# Patient Record
Sex: Male | Born: 1980 | Race: White | Hispanic: No | Marital: Single | Smoking: Current every day smoker
Health system: Southern US, Community
[De-identification: ages and names within clinical notes are randomized; demographics above are authoritative.]

## PROBLEM LIST (undated history)

## (undated) HISTORY — PX: INNER EAR SURGERY: SHX679

## (undated) HISTORY — PX: TONSILLECTOMY: SUR1361

## (undated) HISTORY — PX: FOOT SURGERY: SHX648

## (undated) HISTORY — PX: OTHER SURGICAL HISTORY: SHX169

---

## 2016-06-28 ENCOUNTER — Emergency Department (HOSPITAL_COMMUNITY): Payer: Self-pay

## 2016-06-28 ENCOUNTER — Emergency Department (HOSPITAL_COMMUNITY)
Admission: EM | Admit: 2016-06-28 | Discharge: 2016-06-28 | Disposition: A | Discharge status: Home / Self Care | Payer: Self-pay | Attending: Emergency Medicine | Admitting: Emergency Medicine

## 2016-06-28 ENCOUNTER — Encounter (HOSPITAL_COMMUNITY): Payer: Self-pay

## 2016-06-28 DIAGNOSIS — L03114 Cellulitis of left upper limb: Secondary | ICD-10-CM | POA: Insufficient documentation

## 2016-06-28 DIAGNOSIS — L0291 Cutaneous abscess, unspecified: Secondary | ICD-10-CM

## 2016-06-28 DIAGNOSIS — F17200 Nicotine dependence, unspecified, uncomplicated: Secondary | ICD-10-CM | POA: Insufficient documentation

## 2016-06-28 LAB — CBC WITH DIFFERENTIAL/PLATELET
BASOS PCT: 0 %
Basophils Absolute: 0 10*3/uL (ref 0.0–0.1)
EOS ABS: 0.2 10*3/uL (ref 0.0–0.7)
Eosinophils Relative: 1 %
HEMATOCRIT: 45.5 % (ref 39.0–52.0)
Hemoglobin: 15.5 g/dL (ref 13.0–17.0)
Lymphocytes Relative: 17 %
Lymphs Abs: 2.5 10*3/uL (ref 0.7–4.0)
MCH: 31.9 pg (ref 26.0–34.0)
MCHC: 34.1 g/dL (ref 30.0–36.0)
MCV: 93.6 fL (ref 78.0–100.0)
MONO ABS: 1.1 10*3/uL — AB (ref 0.1–1.0)
MONOS PCT: 8 %
NEUTROS ABS: 10.5 10*3/uL — AB (ref 1.7–7.7)
Neutrophils Relative %: 74 %
Platelets: 252 10*3/uL (ref 150–400)
RBC: 4.86 MIL/uL (ref 4.22–5.81)
RDW: 13.2 % (ref 11.5–15.5)
WBC: 14.2 10*3/uL — ABNORMAL HIGH (ref 4.0–10.5)

## 2016-06-28 LAB — BASIC METABOLIC PANEL
Anion gap: 7 (ref 5–15)
BUN: 9 mg/dL (ref 6–20)
CALCIUM: 9.1 mg/dL (ref 8.9–10.3)
CO2: 25 mmol/L (ref 22–32)
CREATININE: 0.77 mg/dL (ref 0.61–1.24)
Chloride: 105 mmol/L (ref 101–111)
GFR calc non Af Amer: >60 mL/min (ref >60)
Glucose, Bld: 92 mg/dL (ref 65–99)
Potassium: 3.7 mmol/L (ref 3.5–5.1)
Sodium: 137 mmol/L (ref 135–145)

## 2016-06-28 MED ORDER — MORPHINE SULFATE (PF) 4 MG/ML IV SOLN
4.0000 mg | Freq: Once | INTRAVENOUS | Status: AC
  Administered 2016-06-28: 4 mg via INTRAVENOUS
  Filled 2016-06-28: qty 1

## 2016-06-28 MED ORDER — SULFAMETHOXAZOLE-TRIMETHOPRIM 800-160 MG PO TABS: 1.0000 | ORAL_TABLET | Freq: Two times a day (BID) | ORAL | 0 refills | Status: AC

## 2016-06-28 MED ORDER — DICLOFENAC SODIUM 75 MG PO TBEC
75.0000 mg | DELAYED_RELEASE_TABLET | Freq: Two times a day (BID) | ORAL | 0 refills | Status: AC
Start: 2016-06-28 — End: ?

## 2016-06-28 MED ORDER — HYDROCODONE-ACETAMINOPHEN 5-325 MG PO TABS
2.0000 | ORAL_TABLET | Freq: Once | ORAL | Status: AC
  Administered 2016-06-28: 2 via ORAL
  Filled 2016-06-28: qty 2

## 2016-06-28 MED ORDER — HYDROCODONE-ACETAMINOPHEN 7.5-325 MG PO TABS: 1.0000 | ORAL_TABLET | ORAL | 0 refills | Status: AC | PRN

## 2016-06-28 MED ORDER — ONDANSETRON HCL 4 MG/2ML IJ SOLN
4.0000 mg | Freq: Once | INTRAMUSCULAR | Status: AC
  Administered 2016-06-28: 4 mg via INTRAVENOUS
  Filled 2016-06-28: qty 2

## 2016-06-28 MED ORDER — BACITRACIN-NEOMYCIN-POLYMYXIN 400-5-5000 EX OINT
TOPICAL_OINTMENT | Freq: Once | CUTANEOUS | Status: AC
  Administered 2016-06-28: 1 via TOPICAL
  Filled 2016-06-28: qty 1

## 2016-06-28 MED ORDER — PROMETHAZINE HCL 12.5 MG PO TABS
12.5000 mg | ORAL_TABLET | Freq: Once | ORAL | Status: AC
  Administered 2016-06-28: 12.5 mg via ORAL
  Filled 2016-06-28: qty 1

## 2016-06-28 MED ORDER — IOPAMIDOL (ISOVUE-300) INJECTION 61%
100.0000 mL | Freq: Once | INTRAVENOUS | Status: AC | PRN
  Administered 2016-06-28: 100 mL via INTRAVENOUS

## 2016-06-28 MED ORDER — INSULIN ASPART 100 UNIT/ML ~~LOC~~ SOLN
SUBCUTANEOUS | Status: AC
  Filled 2016-06-28: qty 1

## 2016-06-28 MED ORDER — HYDROMORPHONE HCL 1 MG/ML IJ SOLN
1.0000 mg | Freq: Once | INTRAMUSCULAR | Status: AC
  Administered 2016-06-28: 1 mg via INTRAVENOUS
  Filled 2016-06-28: qty 1

## 2016-06-28 MED ORDER — VANCOMYCIN HCL IN DEXTROSE 1-5 GM/200ML-% IV SOLN
1000.0000 mg | Freq: Once | INTRAVENOUS | Status: AC
  Administered 2016-06-28: 1000 mg via INTRAVENOUS
  Filled 2016-06-28: qty 200

## 2016-06-28 NOTE — ED Notes (Signed)
Pt ambulatory to waiting room. Pt verbalized understanding of discharge instructions.   

## 2016-06-28 NOTE — ED Provider Notes (Signed)
[facility]PT Provider Note   CSN: [id number] Arrival date & time: 06/28/16  1617     History   Chief Complaint Chief Complaint  Patient presents with  . Abscess    HPI Kanden Carey is a 35 y.o. male.  Patient is a 35 year old male who presents to the emergency department with a complaint of an abscess of his left elbow.  The patient states that 3 days ago he noted a small pimple on his elbow. The following day that it was larger. He states that he took some of his amoxicillin that he had from a previous dental visit. He states it feels as though it's getting worse. Today he noticed the left elbow being hot and red. The patient states that he stuck something in the elbow trying to see if there was any pus in it. He states he did not get any purulent material out. He states that the problem is getting progressively worse as the day progresses. He has had some chills, but has not measured a temperature elevation at home. He states he feels fine other than increasing pain in his left elbow and arm.      History reviewed. No pertinent past medical history.  There are no active problems to display for this patient.   Past Surgical History:  Procedure Laterality Date  . FOOT SURGERY    . INNER EAR SURGERY    . TONSILLECTOMY    . tubes in ears         Home Medications    Prior to Admission medications   Not on File    Family History No family history on file.  Social History Social History  Substance Use Topics  . Smoking status: Current Every Day Smoker  . Smokeless tobacco: Never Used  . Alcohol use Yes     Comment: 2 or 3 beers per day at most     Allergies   Patient has no known allergies.   Review of Systems Review of Systems  Constitutional: Positive for chills. Negative for activity change.       All ROS Neg except as noted in HPI  HENT: Negative for nosebleeds.   Eyes: Negative for photophobia and discharge.  Respiratory: Negative for  cough, shortness of breath and wheezing.   Cardiovascular: Negative for chest pain and palpitations.  Gastrointestinal: Negative for abdominal pain and blood in stool.  Genitourinary: Negative for dysuria, frequency and hematuria.  Musculoskeletal: Positive for arthralgias. Negative for back pain and neck pain.  Skin: Negative.   Neurological: Negative for dizziness, seizures and speech difficulty.  Psychiatric/Behavioral: Negative for confusion and hallucinations.     Physical Exam Updated Vital Signs BP (!) 153/101   Pulse 90   Temp 99 F (37.2 C) (Oral)   Resp 16   Ht 5\' 6"  (1.676 m)   Wt 81.6 kg   SpO2 99%   BMI 29.05 kg/m   Physical Exam  Constitutional: He is oriented to person, place, and time. He appears well-developed and well-nourished.  Non-toxic appearance.  HENT:  Head: Normocephalic.  Right Ear: Tympanic membrane and external ear normal.  Left Ear: Tympanic membrane and external ear normal.  Eyes: EOM and lids are normal. Pupils are equal, round, and reactive to light.  Neck: Normal range of motion. Neck supple. Carotid bruit is not present.  Cardiovascular: Normal rate, regular rhythm, normal heart sounds, intact distal pulses and normal pulses.   Pulmonary/Chest: Breath sounds normal. No respiratory distress.  Abdominal: Soft.  Bowel sounds are normal. There is no tenderness. There is no guarding.  Musculoskeletal: He exhibits tenderness.       Left elbow: He exhibits decreased range of motion and swelling. Tenderness found. Lateral epicondyle and olecranon process tenderness noted.  There is good range of motion of the left shoulder. There is increased redness and swelling of the left elbow. There is an open wound area on the olecranon area of the left elbow. The redness and swelling extends from the mid tricep area to the mid forearm area. The radial pulses 2+. Capillary refill is less than 2 seconds. This range of motion of the left wrist and fingers.    Lymphadenopathy:       Head (right side): No submandibular adenopathy present.       Head (left side): No submandibular adenopathy present.    He has no cervical adenopathy.  Neurological: He is alert and oriented to person, place, and time. He has normal strength. No cranial nerve deficit or sensory deficit.  Skin: Skin is warm and dry.  Psychiatric: He has a normal mood and affect. His speech is normal.  Nursing note and vitals reviewed.    ED Treatments / Results  Labs (all labs ordered are listed, but only abnormal results are displayed) Labs Reviewed - No data to display  EKG  EKG Interpretation None       Radiology No results found.  Procedures Procedures (including critical care time)  Medications Ordered in ED Medications - No data to display   Initial Impression / Assessment and Plan / ED Course  I have reviewed the triage vital signs and the nursing notes.  Pertinent labs & imaging results that were available during my care of the patient were reviewed by me and considered in my medical decision making (see chart for details).  Clinical Course    Patient seen with me by Dr. Para SkeansJ. Haviland **I have reviewed nursing notes, vital signs, and all appropriate lab and imaging results for this patient.*  Final Clinical Impressions(s) / ED Diagnoses  Vital signs reviewed. Blood pressure is elevated. No gross neurovascular deficits appreciated. Patient complaining of increasing pain. Treated with IV morphine.  Complete blood count returns showing a white blood cell count of 14,200, there is no shift to the left.  Patient states that the morphine did not help his pain much at all. Patient treated with IV Dilaudid.  BASIC metabolic panel is within normal limits.  Patient states he is only receiving mild to moderate amount of relief from the IV Dilaudid. CT scan of the elbow with contrast shows cellulitis along the posterior and lateral portion of the arm with the  maximum being at the elbow. Inflammation extends to the superficial surface of the muscle without evidence of myositis. There is no abscess or drainable fluid collection noted at this time. There is no effusion of the elbow. No osteomyelitis noted.  Vancomycin has been started. Patient tolerating the antibiotic without problem.   Patient has completed the vancomycin, and tolerated the antibiotic without problem. The plan at this time is for the patient to be treated with Septra and diclofenac 2 times daily with food. Prescription for Norco also given to the patient to use for pain. The patient will return to the emergency department or see his primary physician on December 22 for recheck of cellulitis.    Final diagnoses:  Cellulitis of left arm    New Prescriptions New Prescriptions   No medications on file  Ivery QualeHobson Ahmia Colford, PA-C 06/28/16 2158    Jacalyn LefevreJulie Haviland, MD 06/28/16 2252

## 2016-06-28 NOTE — ED Triage Notes (Signed)
Pt reports abscess to left elbow for the past few days.  Pt took some amoxicillin that he had left over from the dentist last night and today.

## 2016-06-28 NOTE — Discharge Instructions (Signed)
Your examination and CT scan suggests cellulitis involving your left arm and elbow area. You were treated tonight with intravenous vancomycin. Please use Septra and diclofenac 2 times daily with food. Use Norco for pain. This medication may cause drowsiness, please use with caution. Please see your primary physician, or return to the emergency department on Friday, December 22 for recheck of your left arm cellulitis.

## 2016-07-03 LAB — CULTURE, BLOOD (ROUTINE X 2)
CULTURE: NO GROWTH
Culture: NO GROWTH

## 2018-06-22 IMAGING — CT CT ELBOW*L* W/CM
3 of 5 series · 11 of 35 positions shown, 13 images · IV contrast (agent unspecified)
Comparison: None.

CLINICAL DATA: 35-year-old male with infected skin lesion of the
elbow. Three days of pain redness and swelling. Query abscess.
Initial encounter.

EXAM:
CT OF THE UPPER LEFT EXTREMITY WITH CONTRAST
TECHNIQUE: Multidetector CT imaging of the upper left extremity was performed
according to the standard protocol following intravenous contrast
administration.

[Series 4: thin soft · axial · 0.30mm/px · z∈[+141,+261]mm · 3 of 710 slices shown, 4 images]
[im 158/710  soft-tissue]
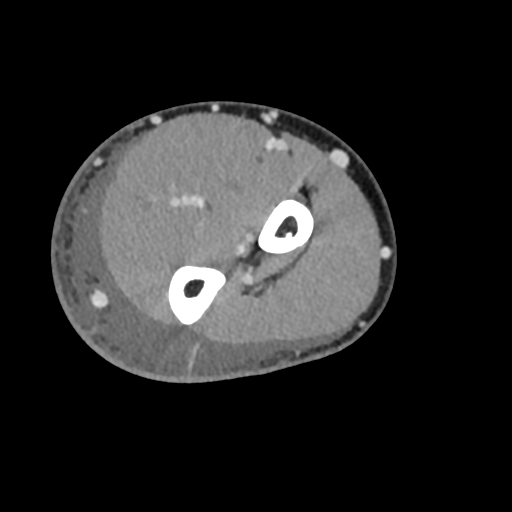
[im 158/710  bone]
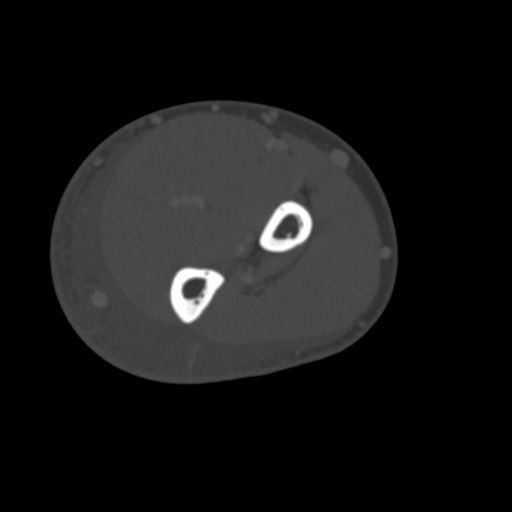
[im 394/710  bone]
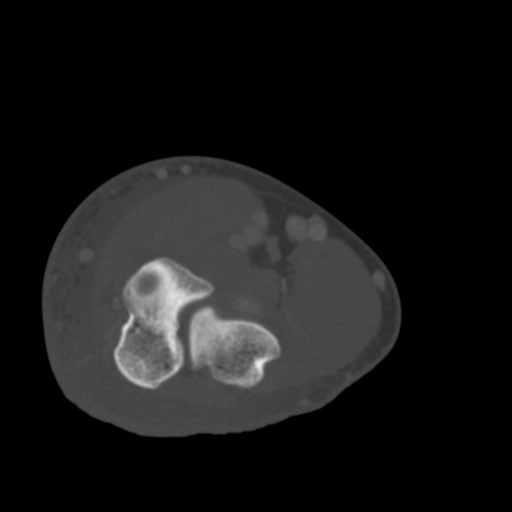
[im 552/710  bone]
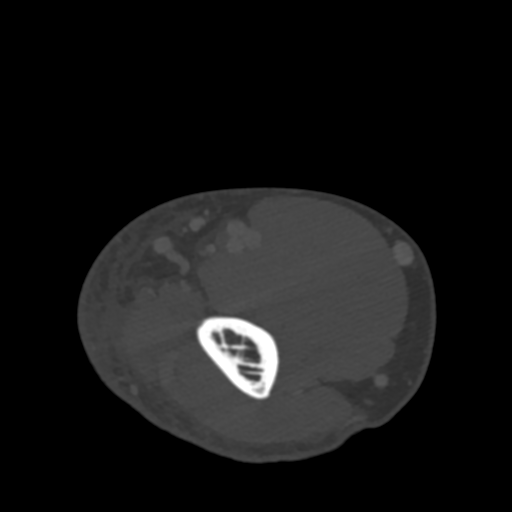

[Series 8: cor st · coronal · 0.28mm/px · 3 of 72 slices shown]
[im 15/72  bone]
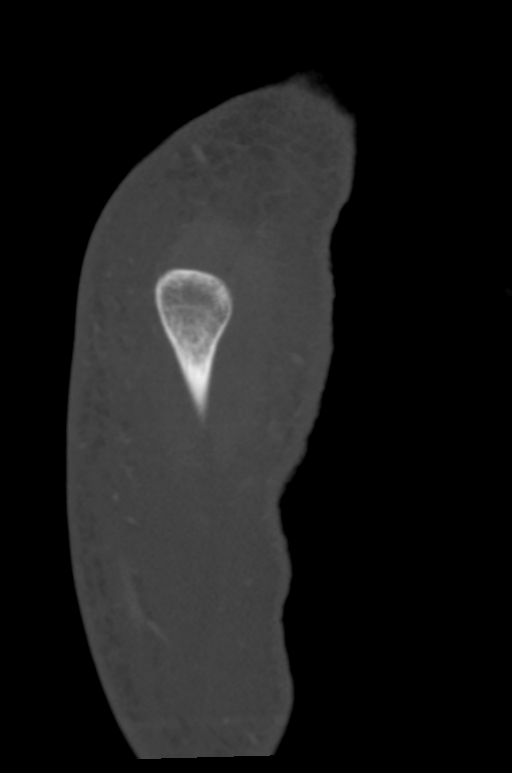
[im 29/72  bone]
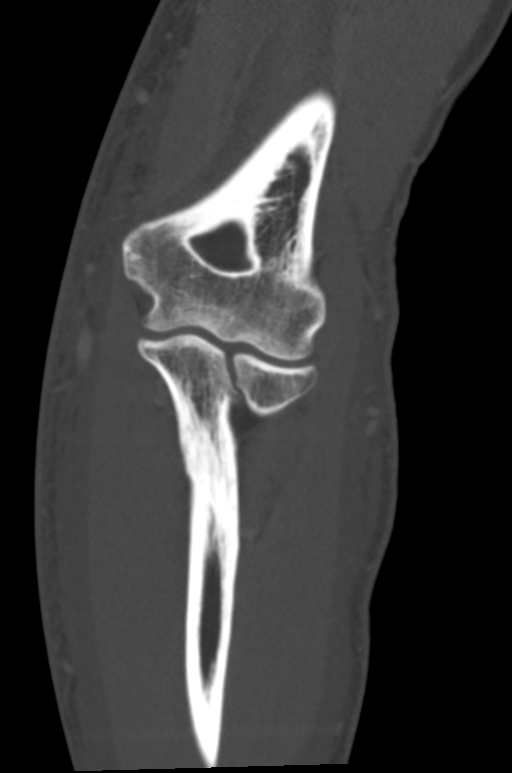
[im 43/72  bone]
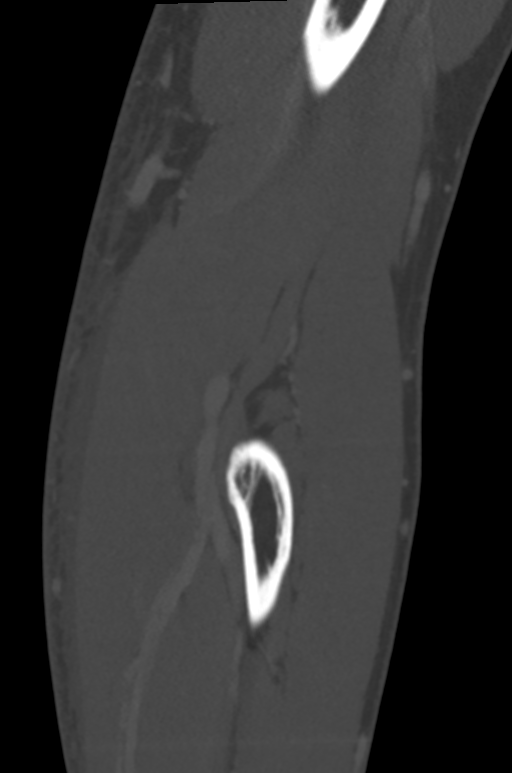

[Series 9: sag st · sagittal · 0.24mm/px · 5 of 73 slices shown, 6 images]
[im 25/73  bone]
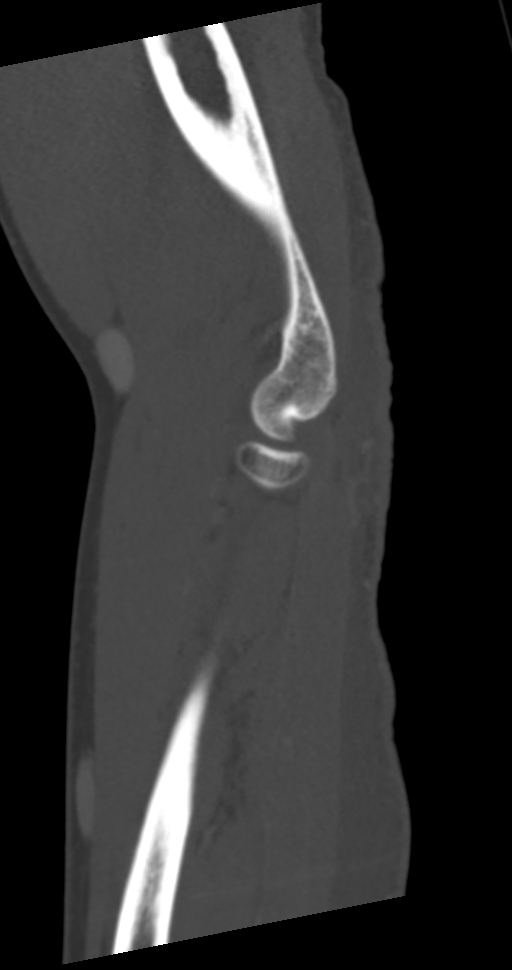
[im 31/73  bone]
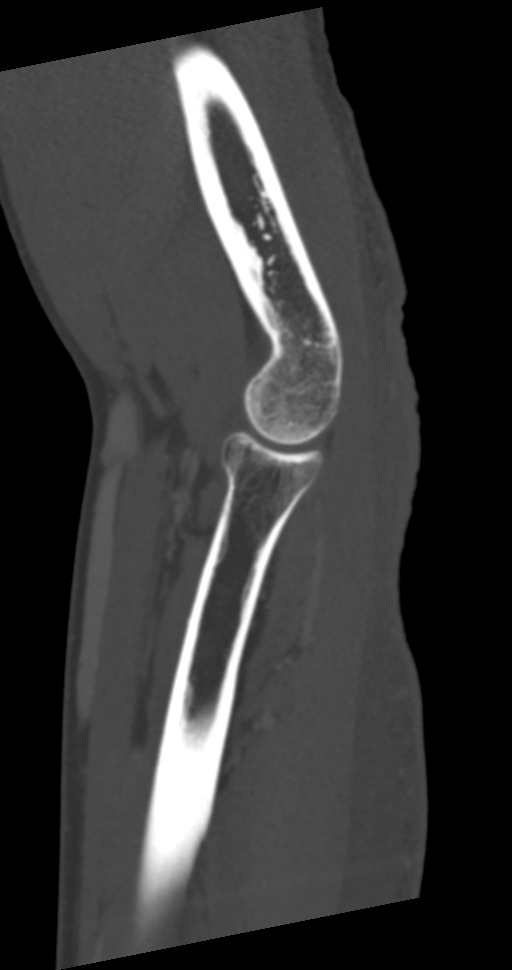
[im 37/73  soft-tissue]
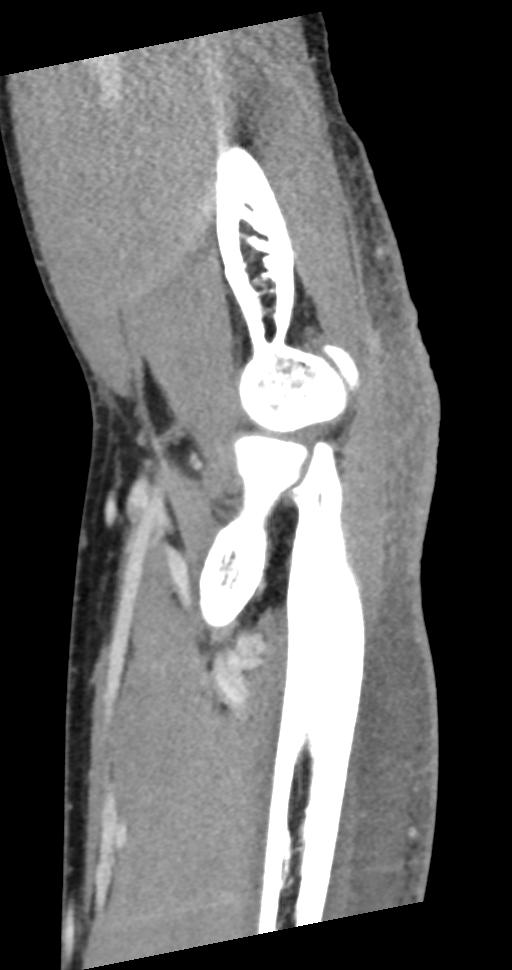
[im 37/73  bone]
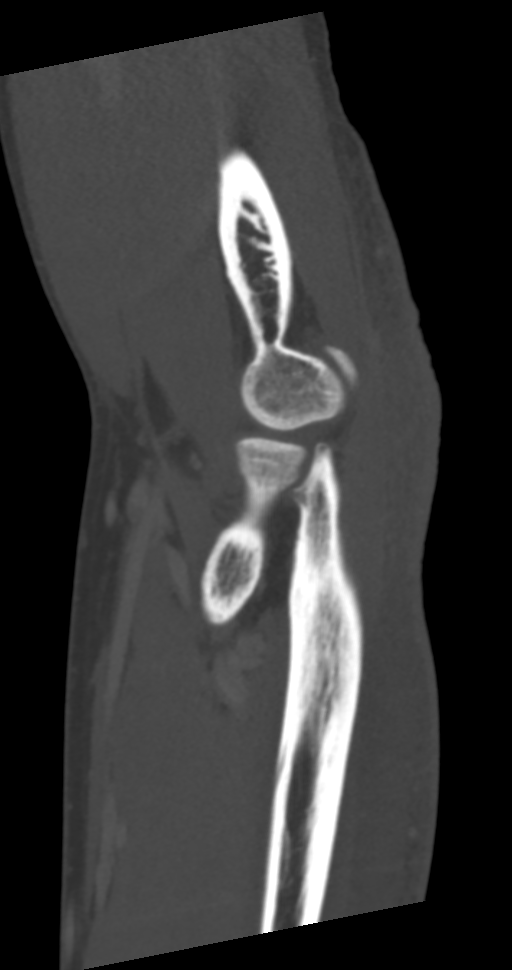
[im 43/73  bone]
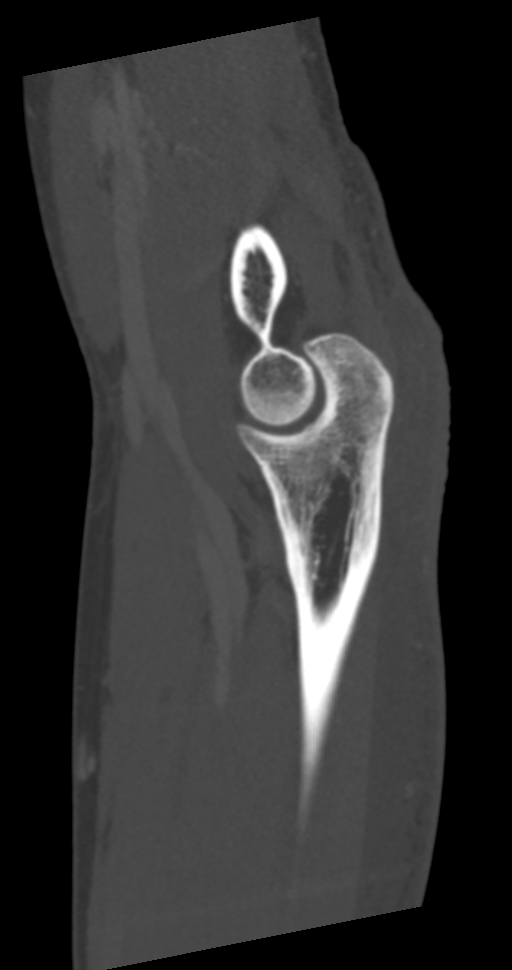
[im 49/73  bone]
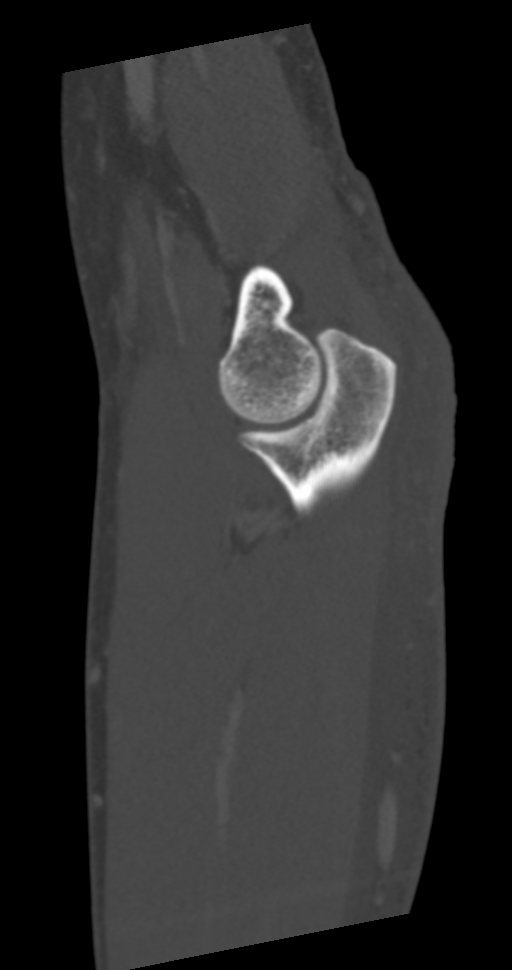

[11 of 35 positions shown; findings below may reference images not displayed]

CONTRAST:  100mL E4OIX6-SNN IOPAMIDOL (E4OIX6-SNN) INJECTION
61%<Contrast>100mL E4OIX6-SNN IOPAMIDOL (E4OIX6-SNN) INJECTION 61%
FINDINGS: Bone mineralization is within normal limits. No joint effusion at
the elbow. No acute osseous abnormality identified. No periosteal
reaction.

Confluent subcutaneous edema beginning in the distal upper arm and
continuing along the posterior and lateral aspects of the elbow into
the forearm. The superficial surface of the underlying muscles are
affected, but there is no muscle heterogeneity or expansion. The
regional artery's in veins appear to be normally enhancing.

There is no organized or drainable fluid collection identified. No
subcutaneous gas.
IMPRESSION: 1. Cellulitis along the posterior and lateral arm, maximal at the
elbow. Inflammation extends to the superficial surface of the
muscles without CT evidence of myositis.
2. No abscess or drainable fluid collection at this time.
3. No elbow joint effusion.  No CT evidence of osteomyelitis.
